# Patient Record
Sex: Female | Born: 1965
Health system: Southern US, Community
[De-identification: ages and names within clinical notes are randomized; demographics above are authoritative.]

## PROBLEM LIST (undated history)

## (undated) DIAGNOSIS — D649 Anemia, unspecified: Secondary | ICD-10-CM

## (undated) DIAGNOSIS — E785 Hyperlipidemia, unspecified: Secondary | ICD-10-CM

## (undated) DIAGNOSIS — T7840XA Allergy, unspecified, initial encounter: Secondary | ICD-10-CM

## (undated) DIAGNOSIS — Z9889 Other specified postprocedural states: Secondary | ICD-10-CM

## (undated) DIAGNOSIS — R112 Nausea with vomiting, unspecified: Secondary | ICD-10-CM

## (undated) HISTORY — DX: Nausea with vomiting, unspecified: R11.2

## (undated) HISTORY — PX: OTHER SURGICAL HISTORY: SHX169

## (undated) HISTORY — DX: Hyperlipidemia, unspecified: E78.5

## (undated) HISTORY — DX: Allergy, unspecified, initial encounter: T78.40XA

## (undated) HISTORY — DX: Other specified postprocedural states: Z98.890

## (undated) HISTORY — PX: WISDOM TOOTH EXTRACTION: SHX21

## (undated) HISTORY — DX: Anemia, unspecified: D64.9

---

## 2001-12-29 ENCOUNTER — Other Ambulatory Visit: Admission: RE | Admit: 2001-12-29 | Discharge: 2001-12-29 | Payer: Self-pay | Admitting: Obstetrics & Gynecology

## 2002-08-01 ENCOUNTER — Encounter: Payer: Self-pay | Admitting: Obstetrics and Gynecology

## 2002-08-01 ENCOUNTER — Inpatient Hospital Stay (HOSPITAL_COMMUNITY): Admission: AD | Admit: 2002-08-01 | Discharge: 2002-08-01 | Payer: Self-pay | Admitting: *Deleted

## 2002-08-07 ENCOUNTER — Inpatient Hospital Stay (HOSPITAL_COMMUNITY): Admission: AD | Admit: 2002-08-07 | Discharge: 2002-08-09 | Payer: Self-pay | Admitting: Obstetrics and Gynecology

## 2003-12-08 ENCOUNTER — Other Ambulatory Visit: Admission: RE | Admit: 2003-12-08 | Discharge: 2003-12-08 | Payer: Self-pay | Admitting: Obstetrics and Gynecology

## 2012-05-27 ENCOUNTER — Other Ambulatory Visit: Payer: Self-pay | Admitting: Obstetrics and Gynecology

## 2012-05-27 DIAGNOSIS — R928 Other abnormal and inconclusive findings on diagnostic imaging of breast: Secondary | ICD-10-CM

## 2012-06-04 ENCOUNTER — Ambulatory Visit
Admission: RE | Admit: 2012-06-04 | Discharge: 2012-06-04 | Disposition: A | Discharge status: Home / Self Care | Payer: BC Managed Care – PPO | Source: Ambulatory Visit | Attending: Obstetrics and Gynecology | Admitting: Obstetrics and Gynecology

## 2012-06-04 DIAGNOSIS — R928 Other abnormal and inconclusive findings on diagnostic imaging of breast: Secondary | ICD-10-CM

## 2013-01-27 ENCOUNTER — Ambulatory Visit: Payer: Self-pay

## 2013-05-12 HISTORY — PX: CHOLECYSTECTOMY: SHX55

## 2013-11-09 ENCOUNTER — Encounter (INDEPENDENT_AMBULATORY_CARE_PROVIDER_SITE_OTHER): Payer: Self-pay | Admitting: General Surgery

## 2013-11-09 ENCOUNTER — Ambulatory Visit (INDEPENDENT_AMBULATORY_CARE_PROVIDER_SITE_OTHER): Payer: BC Managed Care – PPO | Admitting: General Surgery

## 2013-11-09 VITALS — BP 126/84 | HR 73 | Temp 97.7°F | Ht 65.0 in | Wt 165.0 lb

## 2013-11-09 DIAGNOSIS — K8020 Calculus of gallbladder without cholecystitis without obstruction: Secondary | ICD-10-CM

## 2013-11-09 NOTE — Progress Notes (Signed)
Patient ID: Sydney Knapp, female   DOB: 14-Dec-1965, 48 y.o.   MRN: [id number]  Chief Complaint  Patient presents with  . gallbaldder    HPI Sydney Knapp is a 48 y.o. female.  Patient is a 48 year old female was referred by Dr. Gilford Rile for evaluation of symptomatic cholelithiasis. Patient is long history of epigastric right upper quadrant pain. She did notice that the last episode was triggered by a fatty meal. This recurred last week.   The patient was previous workup with an ultrasound which revealed multiple gallstones. LFTs were within normal limits. HPI  Past Medical History  Diagnosis Date  . Anemia     History reviewed. No pertinent past surgical history.  History reviewed. No pertinent family history.  Social History History  Substance Use Topics  . Smoking status: Never Smoker   . Smokeless tobacco: Not on file  . Alcohol Use: No    No Known Allergies  No current outpatient prescriptions on file.   No current facility-administered medications for this visit.    Review of Systems Review of Systems  Constitutional: Negative.   HENT: Negative.   Respiratory: Negative.   Cardiovascular: Negative.   Gastrointestinal: Negative.   Neurological: Negative.   All other systems reviewed and are negative.   Blood pressure 126/84, pulse 73, temperature 97.7 F (36.5 C), height 5\' 5"  (1.651 m), weight 165 lb (74.844 kg).  Physical Exam Physical Exam  Constitutional: She is oriented to person, place, and time. She appears well-developed and well-nourished.  HENT:  Head: Normocephalic and atraumatic.  Eyes: Conjunctivae and EOM are normal. Pupils are equal, round, and reactive to light.  Neck: Normal range of motion. Neck supple.  Cardiovascular: Normal rate, regular rhythm and normal heart sounds.   Pulmonary/Chest: Effort normal and breath sounds normal.  Abdominal: Soft. Bowel sounds are normal. She exhibits no distension and no mass. There is no tenderness.  There is no rebound and no guarding.  Musculoskeletal: Normal range of motion.  Neurological: She is alert and oriented to person, place, and time.  Skin: Skin is warm and dry.  Psychiatric: She has a normal mood and affect.    Data Reviewed Ultrasound reveals multiple gallstones  Assessment    48 year old female with symptomatic cholelithiasis     Plan    1. We'll proceed to the operating room for laparoscopic cholecystectomy 2. All risks and benefits were discussed with the patient to generally include: infection, bleeding, possible need for post op ERCP, damage to the bile ducts, and bile leak. Alternatives were offered and described.  All questions were answered and the patient voiced understanding of the procedure and wishes to proceed at this point with a laparoscopic cholecystectomy         Rosario Jacks., Luanne Krzyzanowski 11/09/2013, 9:12 AM

## 2013-11-18 ENCOUNTER — Ambulatory Visit (INDEPENDENT_AMBULATORY_CARE_PROVIDER_SITE_OTHER): Payer: BC Managed Care – PPO | Admitting: General Surgery

## 2013-12-30 ENCOUNTER — Other Ambulatory Visit (INDEPENDENT_AMBULATORY_CARE_PROVIDER_SITE_OTHER): Payer: Self-pay

## 2013-12-30 DIAGNOSIS — K8010 Calculus of gallbladder with chronic cholecystitis without obstruction: Secondary | ICD-10-CM

## 2013-12-30 MED ORDER — OXYCODONE-ACETAMINOPHEN 5-325 MG PO TABS: 1.0000 | ORAL_TABLET | ORAL | Status: AC | PRN

## 2014-01-02 ENCOUNTER — Telehealth (INDEPENDENT_AMBULATORY_CARE_PROVIDER_SITE_OTHER): Payer: Self-pay | Admitting: General Surgery

## 2014-01-02 ENCOUNTER — Other Ambulatory Visit (INDEPENDENT_AMBULATORY_CARE_PROVIDER_SITE_OTHER): Payer: Self-pay

## 2014-01-02 NOTE — Telephone Encounter (Signed)
Husband of the patient called and stated that wife has had nausea and vomiting since her gallbladder surgery. Spouse was told that Dr. Rosendo Gros, assistant. Christy, CMA would give the pt a call-back.

## 2014-01-03 ENCOUNTER — Telehealth (INDEPENDENT_AMBULATORY_CARE_PROVIDER_SITE_OTHER): Payer: Self-pay

## 2014-01-03 NOTE — Telephone Encounter (Signed)
Message copied by Ivor Costa on Tue Jan 03, 2014 10:16 AM ------      Message from: Ralene Ok      Created: Tue Jan 03, 2014  7:25 AM       I bet she's taking percocet on an empty stomach.  Can you just call her and see why she's have nausea as per the phone call note.            AR ------

## 2014-01-03 NOTE — Telephone Encounter (Signed)
Follow up call to patient-  Called and spoke to patient to see if she's still having nausea.  Patient reports that she feels much better today.  Patient denies any nausea or vomiting at this time.  Patient post op appointment was changed from 02/02/14 to 01/17/14 @ 9:45 am with Dr. Rosendo Gros.  Patient advised to call our office if her nausea/vomting returns.  Patient verbalized understanding.

## 2014-01-03 NOTE — Telephone Encounter (Signed)
Status: Signed        Follow up call to patient- Called and spoke to patient to see if she's still having nausea. Patient reports that she feels much better today. Patient denies any nausea or vomiting at this time. Patient post op appointment was changed from 02/02/14 to 01/17/14 @ 9:45 am with Dr. Rosendo Gros. Patient advised to call our office if her nausea/vomting returns. Patient verbalized understanding.

## 2014-01-17 ENCOUNTER — Encounter (INDEPENDENT_AMBULATORY_CARE_PROVIDER_SITE_OTHER): Payer: BC Managed Care – PPO | Admitting: General Surgery

## 2014-02-02 ENCOUNTER — Encounter (INDEPENDENT_AMBULATORY_CARE_PROVIDER_SITE_OTHER): Payer: BC Managed Care – PPO | Admitting: General Surgery

## 2016-09-23 DIAGNOSIS — Z1231 Encounter for screening mammogram for malignant neoplasm of breast: Secondary | ICD-10-CM | POA: Diagnosis not present

## 2016-09-23 DIAGNOSIS — Z124 Encounter for screening for malignant neoplasm of cervix: Secondary | ICD-10-CM | POA: Diagnosis not present

## 2016-10-08 DIAGNOSIS — L237 Allergic contact dermatitis due to plants, except food: Secondary | ICD-10-CM | POA: Diagnosis not present

## 2017-04-03 DIAGNOSIS — Z23 Encounter for immunization: Secondary | ICD-10-CM | POA: Diagnosis not present

## 2017-08-17 DIAGNOSIS — Z1389 Encounter for screening for other disorder: Secondary | ICD-10-CM | POA: Diagnosis not present

## 2017-08-17 DIAGNOSIS — Z0000 Encounter for general adult medical examination without abnormal findings: Secondary | ICD-10-CM | POA: Diagnosis not present

## 2017-08-26 DIAGNOSIS — M7712 Lateral epicondylitis, left elbow: Secondary | ICD-10-CM | POA: Diagnosis not present

## 2017-09-22 ENCOUNTER — Encounter: Payer: Self-pay | Admitting: Internal Medicine

## 2017-11-11 DIAGNOSIS — Z1231 Encounter for screening mammogram for malignant neoplasm of breast: Secondary | ICD-10-CM | POA: Diagnosis not present

## 2017-11-11 DIAGNOSIS — Z01419 Encounter for gynecological examination (general) (routine) without abnormal findings: Secondary | ICD-10-CM | POA: Diagnosis not present

## 2018-01-08 ENCOUNTER — Other Ambulatory Visit: Payer: Self-pay | Admitting: Obstetrics and Gynecology

## 2018-01-08 DIAGNOSIS — N6320 Unspecified lump in the left breast, unspecified quadrant: Secondary | ICD-10-CM

## 2018-01-08 DIAGNOSIS — N63 Unspecified lump in unspecified breast: Secondary | ICD-10-CM | POA: Diagnosis not present

## 2018-01-12 ENCOUNTER — Ambulatory Visit
Admission: RE | Admit: 2018-01-12 | Discharge: 2018-01-12 | Disposition: A | Discharge status: Home / Self Care | Payer: 59 | Source: Ambulatory Visit | Attending: Obstetrics and Gynecology | Admitting: Obstetrics and Gynecology

## 2018-01-12 DIAGNOSIS — N6320 Unspecified lump in the left breast, unspecified quadrant: Secondary | ICD-10-CM

## 2018-01-12 DIAGNOSIS — N6322 Unspecified lump in the left breast, upper inner quadrant: Secondary | ICD-10-CM | POA: Diagnosis not present

## 2018-01-12 DIAGNOSIS — N6321 Unspecified lump in the left breast, upper outer quadrant: Secondary | ICD-10-CM | POA: Diagnosis not present

## 2018-01-12 DIAGNOSIS — R922 Inconclusive mammogram: Secondary | ICD-10-CM | POA: Diagnosis not present

## 2018-02-10 DIAGNOSIS — Z23 Encounter for immunization: Secondary | ICD-10-CM | POA: Diagnosis not present

## 2018-08-09 DIAGNOSIS — E7849 Other hyperlipidemia: Secondary | ICD-10-CM | POA: Diagnosis not present

## 2018-08-09 DIAGNOSIS — Z0000 Encounter for general adult medical examination without abnormal findings: Secondary | ICD-10-CM | POA: Diagnosis not present

## 2018-08-25 DIAGNOSIS — Z0000 Encounter for general adult medical examination without abnormal findings: Secondary | ICD-10-CM | POA: Diagnosis not present

## 2018-08-26 DIAGNOSIS — D509 Iron deficiency anemia, unspecified: Secondary | ICD-10-CM | POA: Diagnosis not present

## 2018-09-08 ENCOUNTER — Other Ambulatory Visit (HOSPITAL_COMMUNITY): Payer: Self-pay | Admitting: *Deleted

## 2018-09-09 ENCOUNTER — Ambulatory Visit (HOSPITAL_COMMUNITY)
Admission: RE | Admit: 2018-09-09 | Discharge: 2018-09-09 | Disposition: A | Discharge status: Home / Self Care | Payer: 59 | Source: Ambulatory Visit | Attending: Internal Medicine | Admitting: Internal Medicine

## 2018-09-09 ENCOUNTER — Other Ambulatory Visit: Payer: Self-pay

## 2018-09-09 DIAGNOSIS — D649 Anemia, unspecified: Secondary | ICD-10-CM | POA: Diagnosis not present

## 2018-09-09 MED ORDER — SODIUM CHLORIDE 0.9 % IV SOLN
510.0000 mg | Freq: Once | INTRAVENOUS | Status: AC
  Administered 2018-09-09: 510 mg via INTRAVENOUS
  Filled 2018-09-09: qty 510

## 2018-09-09 NOTE — Discharge Instructions (Signed)

## 2018-09-16 DIAGNOSIS — D509 Iron deficiency anemia, unspecified: Secondary | ICD-10-CM | POA: Diagnosis not present

## 2019-04-23 ENCOUNTER — Other Ambulatory Visit: Payer: Self-pay

## 2019-04-23 DIAGNOSIS — Z20822 Contact with and (suspected) exposure to covid-19: Secondary | ICD-10-CM

## 2019-04-23 NOTE — Progress Notes (Signed)
lab

## 2019-04-24 ENCOUNTER — Telehealth: Payer: Self-pay | Admitting: Critical Care Medicine

## 2019-04-24 LAB — NOVEL CORONAVIRUS, NAA: SARS-CoV-2, NAA: DETECTED — AB

## 2019-04-24 NOTE — Telephone Encounter (Signed)
-----   Message from Carlisle Beers, RN sent at 04/24/2019  4:23 PM EST -----  ----- Message ----- From: Interface, [facility] Lab Results In Sent: 04/24/2019   2:36 PM EST To: Mobile Screening Testing Result Pool

## 2019-04-24 NOTE — Telephone Encounter (Signed)
This patient is Covid positive from December 12 event.  She has mild headaches nasal congestion eye irritation loss of taste and smell.  She became symptomatic on the 11th.  She knows she needs to stay in isolation until 22 December.  She is not a candidate for monoclonal antibody

## 2019-09-29 ENCOUNTER — Ambulatory Visit: Payer: 59

## 2019-10-01 ENCOUNTER — Ambulatory Visit: Payer: 59 | Attending: Internal Medicine

## 2019-10-01 DIAGNOSIS — Z23 Encounter for immunization: Secondary | ICD-10-CM

## 2019-10-01 NOTE — Progress Notes (Signed)
   Covid-19 Vaccination Clinic  Name:  Sydney Knapp    MRN: [id number] DOB: 1966/01/18  10/01/2019  [name] was observed post Covid-19 immunization for 15 minutes without incident. She was provided with Vaccine Information Sheet and instruction to access the V-Safe system.   [name] was instructed to call 911 with any severe reactions post vaccine: Marland Kitchen Difficulty breathing  . Swelling of face and throat  . A fast heartbeat  . A bad rash all over body  . Dizziness and weakness   Immunizations Administered    Name Date Dose VIS Date Route   Pfizer COVID-19 Vaccine 10/01/2019  9:57 AM 0.3 mL 07/06/2018 Intramuscular   Manufacturer: [place]   Lot: [id number]   [place]: [id number]

## 2019-10-03 ENCOUNTER — Encounter: Payer: Self-pay | Admitting: Gastroenterology

## 2019-10-24 ENCOUNTER — Ambulatory Visit: Payer: 59 | Attending: Internal Medicine

## 2019-10-24 DIAGNOSIS — Z23 Encounter for immunization: Secondary | ICD-10-CM

## 2019-10-24 NOTE — Progress Notes (Signed)
   Covid-19 Vaccination Clinic  Name:  LATRIA MCCARRON    MRN: [id number] DOB: [birth year]  10/24/2019  Ms. Meske was observed post Covid-19 immunization for 15 minutes without incident. She was provided with Vaccine Information Sheet and instruction to access the V-Safe system.   Ms. Tourigny was instructed to call 911 with any severe reactions post vaccine: Marland Kitchen Difficulty breathing  . Swelling of face and throat  . A fast heartbeat  . A bad rash all over body  . Dizziness and weakness   Immunizations Administered    Name Date Dose VIS Date Route   Pfizer COVID-19 Vaccine 10/24/2019  4:20 PM 0.3 mL 07/06/2018 Intramuscular   Manufacturer: [place]   Lot: [id number]   [place]: [address]-0

## 2019-11-03 ENCOUNTER — Other Ambulatory Visit: Payer: Self-pay

## 2019-11-03 ENCOUNTER — Ambulatory Visit (AMBULATORY_SURGERY_CENTER): Payer: 59 | Admitting: *Deleted

## 2019-11-03 VITALS — Ht 66.0 in | Wt 165.0 lb

## 2019-11-03 DIAGNOSIS — Z1211 Encounter for screening for malignant neoplasm of colon: Secondary | ICD-10-CM

## 2019-11-03 MED ORDER — SUTAB 1479-225-188 MG PO TABS: 24.0000 | ORAL_TABLET | ORAL | 0 refills | Status: DC

## 2019-11-03 NOTE — Progress Notes (Signed)
10-24-2019 covid vacc x2   No egg or soy allergy known to patient   issues with past sedation with any surgeries  or procedures Of PONV   no intubation problems  No diet pills per patient No home 02 use per patient  No blood thinners per patient  Pt denies issues with constipation  No A fib or A flutter  EMMI video sent to pt's e mail  COVID 19 guidelines implemented in [place] today   sutab code to pharmacy, coupon to pt   Due to the COVID-19 pandemic we are asking patients to follow these guidelines. Please only bring one care partner. Please be aware that your care partner may wait in the car in the parking lot or if they feel like they will be too hot to wait in the car, they may wait in the lobby on the 4th floor. All care partners are required to wear a mask the entire time (we do not have any that we can provide them), they need to practice social distancing, and we will do a Covid check for all patient's and care partners when you arrive. Also we will check their temperature and your temperature. If the care partner waits in their car they need to stay in the parking lot the entire time and we will call them on their cell phone when the patient is ready for discharge so they can bring the car to the front of the building. Also all patient's will need to wear a mask into building.  Pt verified name, DOB, address and insurance during PV today. Pt mailed instruction packet to included paper to complete and mail back to [facility] with addressed and stamped envelope, Emmi video, copy of consent form to read and not return, and instructions. sutab  coupon mailed in packet. sutab code to pharmacy  PV completed over the phone. Pt encouraged to call with questions or issues

## 2019-11-04 ENCOUNTER — Encounter: Payer: Self-pay | Admitting: Gastroenterology

## 2019-11-17 ENCOUNTER — Encounter: Payer: Self-pay | Admitting: Certified Registered Nurse Anesthetist

## 2019-11-18 ENCOUNTER — Encounter: Payer: Self-pay | Admitting: Gastroenterology

## 2019-11-18 ENCOUNTER — Ambulatory Visit (AMBULATORY_SURGERY_CENTER): Payer: 59 | Admitting: Gastroenterology

## 2019-11-18 ENCOUNTER — Other Ambulatory Visit: Payer: Self-pay

## 2019-11-18 VITALS — BP 105/67 | HR 62 | Temp 97.5°F | Resp 18 | Ht 66.0 in | Wt 165.0 lb

## 2019-11-18 DIAGNOSIS — Z1211 Encounter for screening for malignant neoplasm of colon: Secondary | ICD-10-CM

## 2019-11-18 DIAGNOSIS — D122 Benign neoplasm of ascending colon: Secondary | ICD-10-CM

## 2019-11-18 DIAGNOSIS — K635 Polyp of colon: Secondary | ICD-10-CM

## 2019-11-18 MED ORDER — SODIUM CHLORIDE 0.9 % IV SOLN: 500.0000 mL | Freq: Once | INTRAVENOUS | Status: DC

## 2019-11-18 NOTE — Progress Notes (Signed)
Report given to PACU, vss 

## 2019-11-18 NOTE — Op Note (Signed)
[place] Patient Name: Sydney Knapp Procedure Date: 11/18/2019 8:48 AM MRN: [id number] Endoscopist: Thornton Park MD, MD Age: 54 Referring MD:  Date of Birth: [birth year] Gender: Female Account #: [id number] Procedure:                Colonoscopy Indications:              Screening for colorectal malignant neoplasm, This                            is the patient's first colonoscopy                           No known family history of colon cancer or polyps Medicines:                Monitored Anesthesia Care Procedure:                Pre-Anesthesia Assessment:                           - Prior to the procedure, a History and Physical                            was performed, and patient medications and                            allergies were reviewed. The patient's tolerance of                            previous anesthesia was also reviewed. The risks                            and benefits of the procedure and the sedation                            options and risks were discussed with the patient.                            All questions were answered, and informed consent                            was obtained. Prior Anticoagulants: The patient has                            taken no previous anticoagulant or antiplatelet                            agents. ASA Grade Assessment: I - A normal, healthy                            patient. After reviewing the risks and benefits,                            the patient was deemed in satisfactory condition to  undergo the procedure.                           After obtaining informed consent, the colonoscope                            was passed under direct vision. Throughout the                            procedure, the patient's blood pressure, pulse, and                            oxygen saturations were monitored continuously. The                            Colonoscope was introduced through  the anus and                            advanced to the 4 cm into the ileum. The                            colonoscopy was performed without difficulty. The                            patient tolerated the procedure well. The quality                            of the bowel preparation was good. The terminal                            ileum, ileocecal valve, appendiceal orifice, and                            rectum were photographed. Scope In: 8:54:49 AM Scope Out: 9:09:14 AM Scope Withdrawal Time: 0 hours 9 minutes 18 seconds  Total Procedure Duration: 0 hours 14 minutes 25 seconds  Findings:                 The perianal and digital rectal examinations were                            normal.                           A 4 mm polyp was found in the ascending colon. The                            polyp was flat. The polyp was removed with a cold                            snare. Resection and retrieval were complete.                            Estimated blood loss was minimal.  The exam was otherwise without abnormality on                            direct and retroflexion views. Complications:            No immediate complications. Estimated blood loss:                            Minimal. Estimated Blood Loss:     Estimated blood loss was minimal. Impression:               - One 4 mm polyp in the ascending colon, removed                            with a cold snare. Resected and retrieved.                           - The examination was otherwise normal on direct                            and retroflexion views. Recommendation:           - Patient has a contact number available for                            emergencies. The signs and symptoms of potential                            delayed complications were discussed with the                            patient. Return to normal activities tomorrow.                            Written discharge instructions  were provided to the                            patient.                           - Resume previous diet.                           - Continue present medications.                           - Await pathology results.                           - Repeat colonoscopy date to be determined after                            pending pathology results are reviewed for                            surveillance.                           -  Emerging evidence supports eating a diet of                            fruits, vegetables, grains, calcium, and yogurt                            while reducing red meat and alcohol may reduce the                            risk of colon cancer.                           - Thank you for allowing me to be involved in your                            colon cancer prevention. Thornton Park MD, MD 11/18/2019 9:16:55 AM This report has been signed electronically.

## 2019-11-18 NOTE — Progress Notes (Signed)
Called to room to assist during endoscopic procedure.  Patient ID and intended procedure confirmed with present staff. Received instructions for my participation in the procedure from the performing physician.  

## 2019-11-18 NOTE — Progress Notes (Signed)
Pt's states no medical or surgical changes since previsit or office visit. 

## 2019-11-18 NOTE — Patient Instructions (Signed)
Handout given for polyps, await pathology results.  YOU HAD AN ENDOSCOPIC PROCEDURE TODAY AT [facility]:   Refer to the procedure report that was given to you for any specific questions about what was found during the examination.  If the procedure report does not answer your questions, please call your gastroenterologist to clarify.  If you requested that your care partner not be given the details of your procedure findings, then the procedure report has been included in a sealed envelope for you to review at your convenience later.  YOU SHOULD EXPECT: Some feelings of bloating in the abdomen. Passage of more gas than usual.  Walking can help get rid of the air that was put into your GI tract during the procedure and reduce the bloating. If you had a lower endoscopy (such as a colonoscopy or flexible sigmoidoscopy) you may notice spotting of blood in your stool or on the toilet paper. If you underwent a bowel prep for your procedure, you may not have a normal bowel movement for a few days.  Please Note:  You might notice some irritation and congestion in your nose or some drainage.  This is from the oxygen used during your procedure.  There is no need for concern and it should clear up in a day or so.  SYMPTOMS TO REPORT IMMEDIATELY:   Following lower endoscopy (colonoscopy or flexible sigmoidoscopy):  Excessive amounts of blood in the stool  Significant tenderness or worsening of abdominal pains  Swelling of the abdomen that is new, acute  Fever of 100F or higher  For urgent or emergent issues, a gastroenterologist can be reached at any hour by calling [phone]. Do not use MyChart messaging for urgent concerns.    DIET:  We do recommend a small meal at first, but then you may proceed to your regular diet.  Drink plenty of fluids but you should avoid alcoholic beverages for 24 hours.  ACTIVITY:  You should plan to take it easy for the rest of today and you should NOT  DRIVE or use heavy machinery until tomorrow (because of the sedation medicines used during the test).    FOLLOW UP: Our staff will call the number listed on your records 48-72 hours following your procedure to check on you and address any questions or concerns that you may have regarding the information given to you following your procedure. If we do not reach you, we will leave a message.  We will attempt to reach you two times.  During this call, we will ask if you have developed any symptoms of COVID 19. If you develop any symptoms (ie: fever, flu-like symptoms, shortness of breath, cough etc.) before then, please call [phone].  If you test positive for Covid 19 in the 2 weeks post procedure, please call and report this information to [place].    If any biopsies were taken you will be contacted by phone or by letter within the next 1-3 weeks.  Please call us at [phone] if you have not heard about the biopsies in 3 weeks.    SIGNATURES/CONFIDENTIALITY: You and/or your care partner have signed paperwork which will be entered into your electronic medical record.  These signatures attest to the fact that that the information above on your After Visit Summary has been reviewed and is understood.  Full responsibility of the confidentiality of this discharge information lies with you and/or your care-partner.

## 2019-11-22 ENCOUNTER — Telehealth: Payer: Self-pay

## 2019-11-22 NOTE — Telephone Encounter (Signed)
  Follow up Call-  Call back number 11/18/2019  Post procedure Call Back phone  # [phone]  Permission to leave phone message Yes  Some recent data might be hidden     Patient questions:  Do you have a fever, pain , or abdominal swelling? No. Pain Score  0 *  Have you tolerated food without any problems? Yes.    Have you been able to return to your normal activities? Yes.    Do you have any questions about your discharge instructions: Diet   No. Medications  No. Follow up visit  No.  Do you have questions or concerns about your Care? No.  Actions: * If pain score is 4 or above: No action needed, pain <4.  1. Have you developed a fever since your procedure? no  2.   Have you had an respiratory symptoms (SOB or cough) since your procedure? no  3.   Have you tested positive for COVID 19 since your procedure no  4.   Have you had any family members/close contacts diagnosed with the COVID 19 since your procedure?  no   If yes to any of these questions please route to Joylene John, RN and Erenest Rasher, RN

## 2019-11-22 NOTE — Telephone Encounter (Signed)
Left message on answering machine. 

## 2019-12-01 ENCOUNTER — Encounter: Payer: Self-pay | Admitting: Gastroenterology

## 2021-09-18 ENCOUNTER — Other Ambulatory Visit: Payer: Self-pay | Admitting: Internal Medicine

## 2021-09-18 DIAGNOSIS — Z0000 Encounter for general adult medical examination without abnormal findings: Secondary | ICD-10-CM

## 2021-10-23 ENCOUNTER — Ambulatory Visit
Admission: RE | Admit: 2021-10-23 | Discharge: 2021-10-23 | Disposition: A | Discharge status: Home / Self Care | Payer: No Typology Code available for payment source | Source: Ambulatory Visit | Attending: Internal Medicine | Admitting: Internal Medicine

## 2021-10-23 DIAGNOSIS — Z0000 Encounter for general adult medical examination without abnormal findings: Secondary | ICD-10-CM

## 2022-07-27 IMAGING — CT CT CARDIAC CORONARY ARTERY CALCIUM SCORE
3 series · 13 of 20 positions shown, 15 images · non-contrast
Comparison: None Available.

CLINICAL DATA: 56-year-old white female

EXAM:
CT CARDIAC CORONARY ARTERY CALCIUM SCORE
TECHNIQUE: Non-contrast imaging through the heart was performed using
prospective ECG gating. Image post processing was performed on an
independent workstation, allowing for quantitative analysis of the
heart and coronary arteries. Note that this exam targets the heart
and the chest was not imaged in its entirety.

[Series 2: calcium scoring 2.00 qr36 bestdiast 69% hrt calciu · axial · 0.35mm/px · z∈[+1544,+1600]mm · 3 of 70 slices shown]
[im 14/70  vessel]
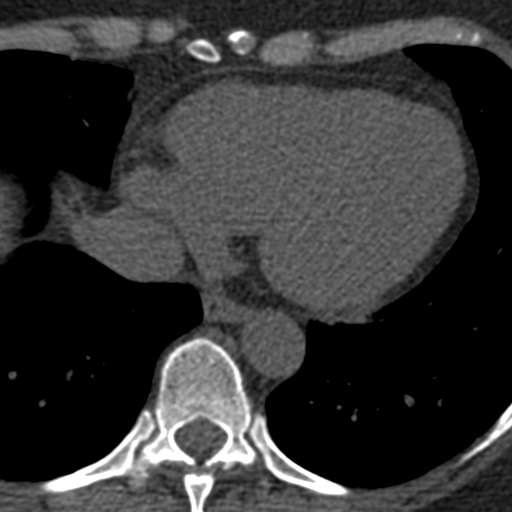
[im 28/70  vessel]
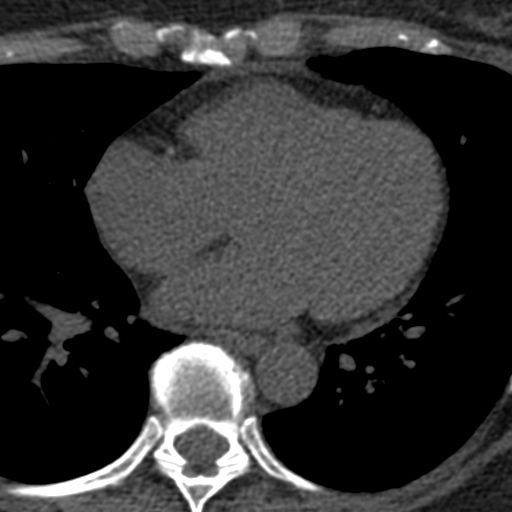
[im 42/70  vessel]
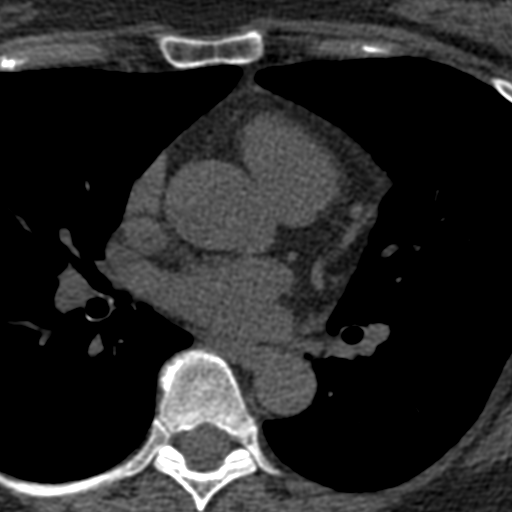

[Series 3: calcium scoring 2.00 br40 bestdiast 69% axial · axial · 0.57mm/px · z∈[+1540,+1632]mm · 5 of 70 slices shown, 7 images]
[im 12/70  vessel]
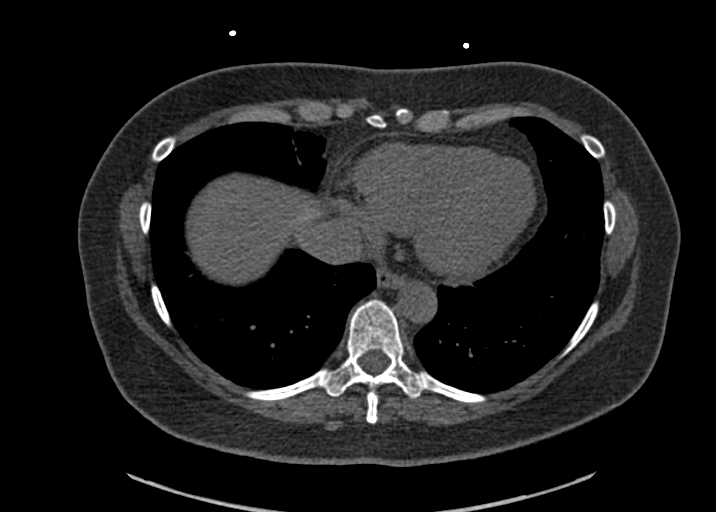
[im 12/70  lung]
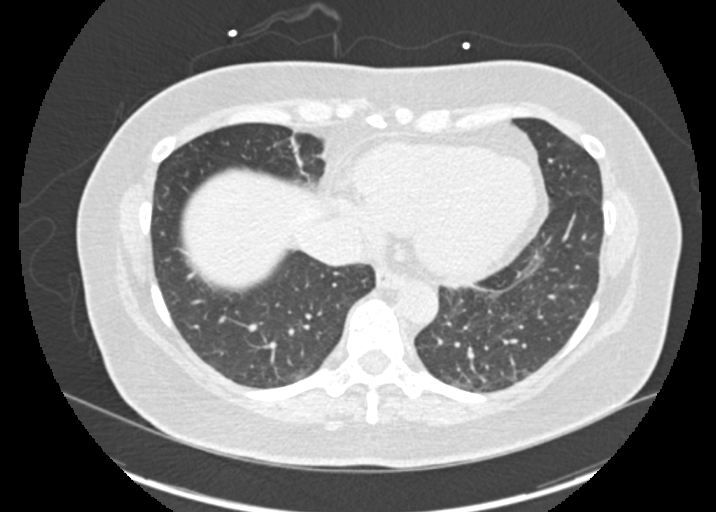
[im 24/70  vessel]
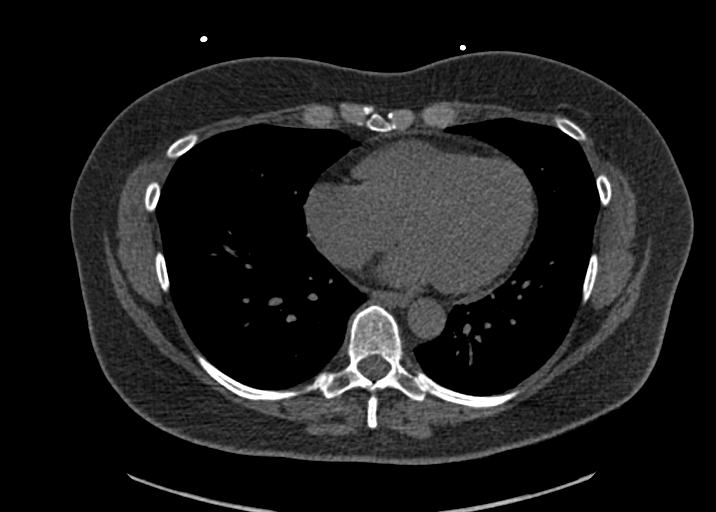
[im 35/70  vessel]
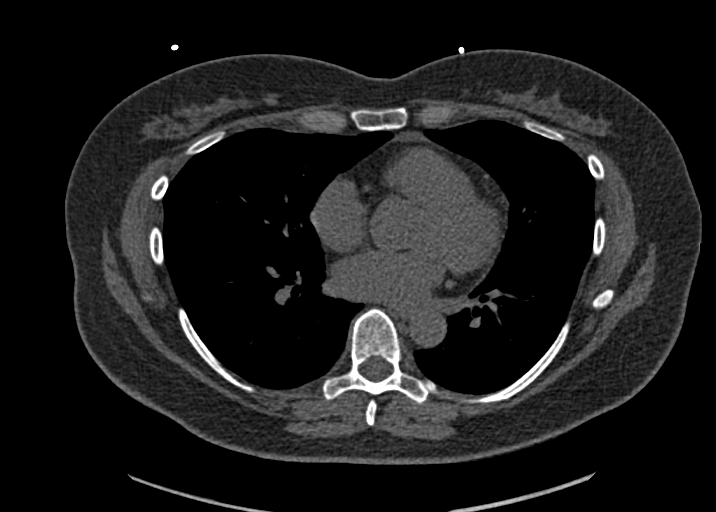
[im 47/70  vessel]
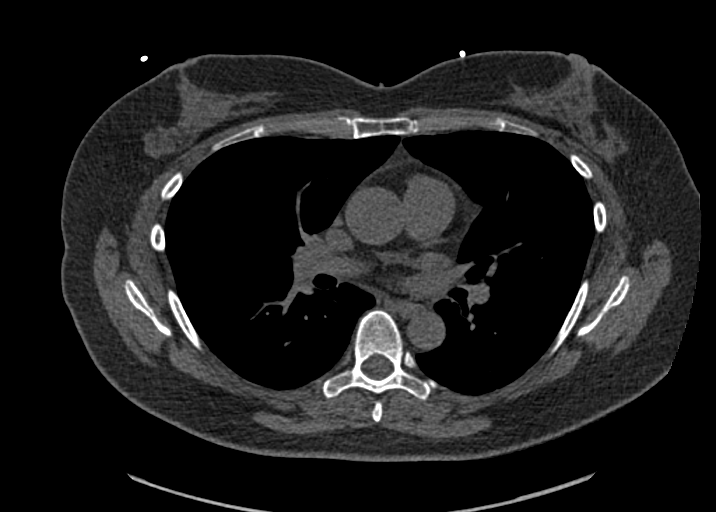
[im 58/70  vessel]
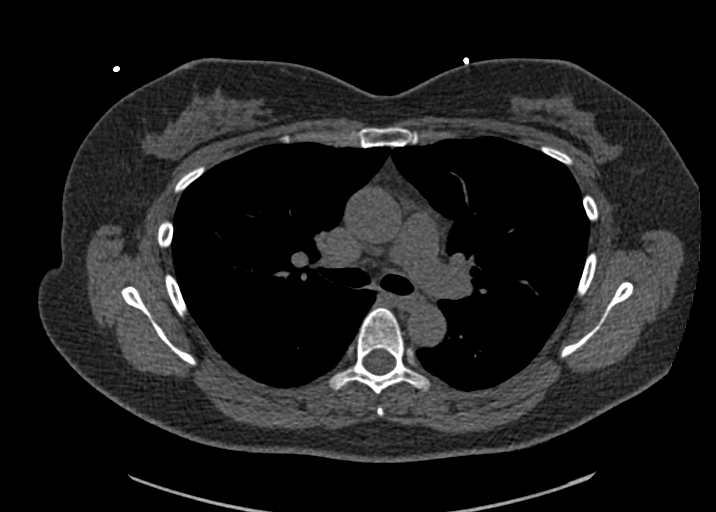
[im 58/70  lung]
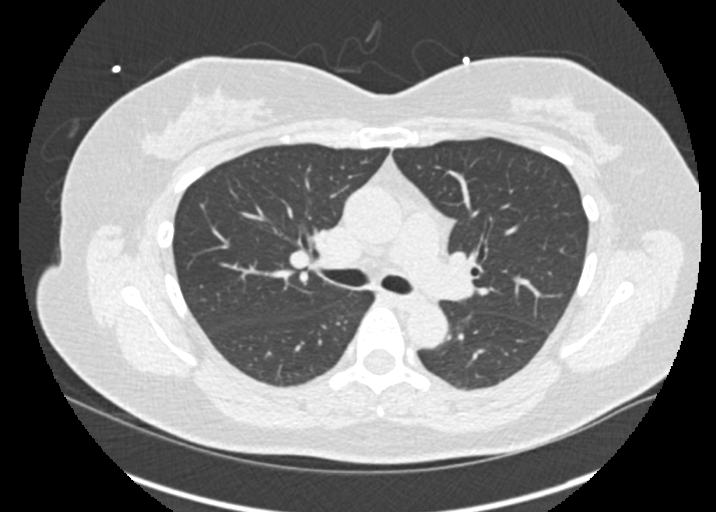

[Series 9: calcium scoring 2.00 br60 bestdiast 69% lungs · axial · 0.57mm/px · z∈[+1540,+1632]mm · 5 of 70 slices shown]
[im 12/70  vessel]
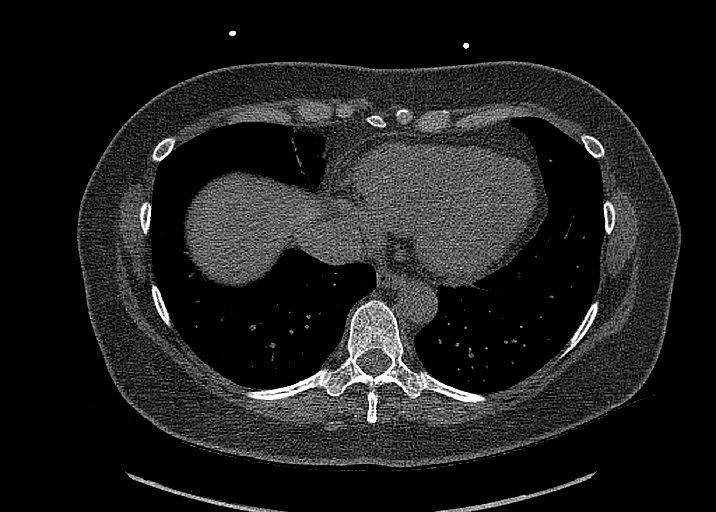
[im 24/70  vessel]
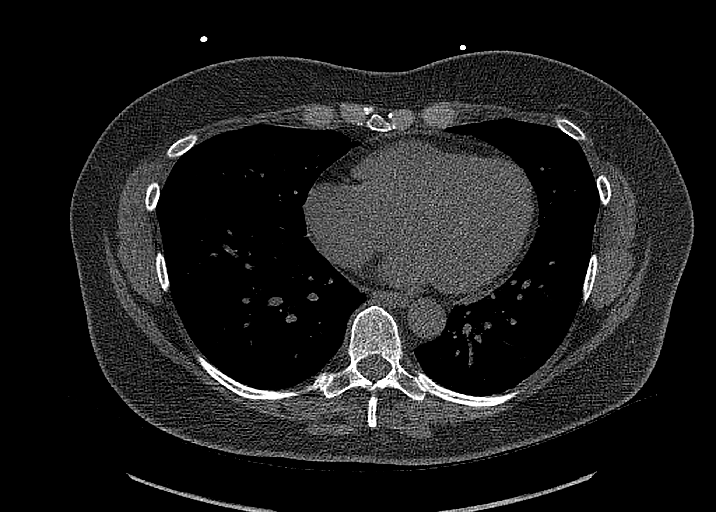
[im 35/70  vessel]
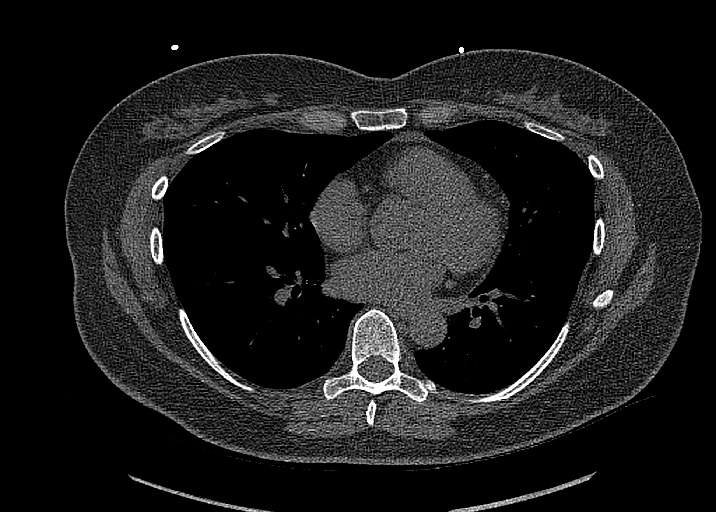
[im 47/70  vessel]
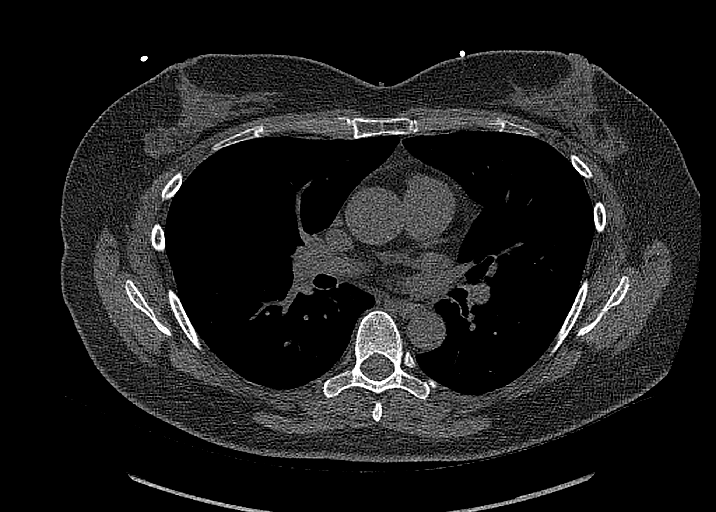
[im 58/70  vessel]
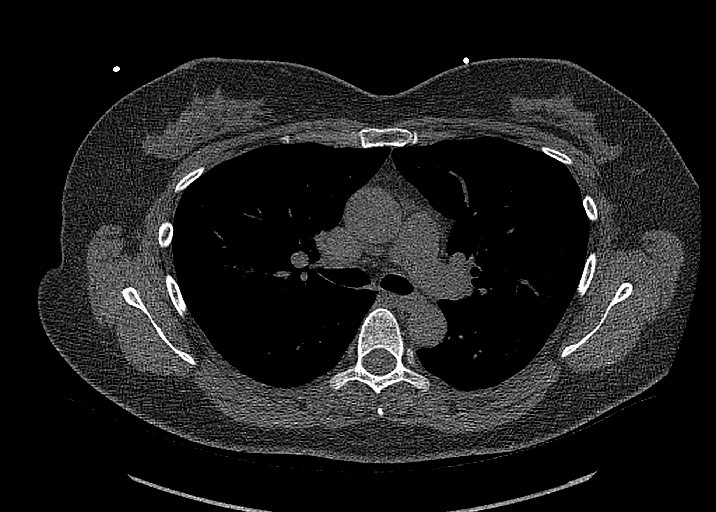

[13 of 20 positions shown; findings below may reference images not displayed]

FINDINGS: CORONARY CALCIUM SCORES:

Left Main: 0

LAD: 0

LCx: 0

RCA: 0

Total Agatston Score: 0

[HOSPITAL] percentile: 0

AORTA MEASUREMENTS:

Ascending Aorta: 33 mm

Descending Aorta: 21 mm

OTHER FINDINGS:

Vascular: Normal heart size. Trace pericardial effusion. Normal
caliber thoracic aorta with no significant atherosclerotic disease.

Mediastinum/Nodes: Esophagus is unremarkable. No enlarged lymph
nodes seen in the chest.

Lungs/Pleura: Central airways are patent. No consolidation, pleural
effusion or pneumothorax. Mild bibasilar atelectasis. Small solid
pulmonary nodule of the right lower lobe measuring 5 mm on series 9,
image 67.

Upper Abdomen: No acute abnormality.

Musculoskeletal: No chest wall mass or suspicious bone lesions
identified.
IMPRESSION: 1. Total calcium score of 0 is at percentile 0 for subjects of the
same age, gender, and race/ethnicity.
2. Small solid pulmonary nodule of the right lower lobe measuring 5
mm. No follow-up needed if patient is low-risk.This recommendation
follows the consensus statement: Guidelines for Management of
Incidental Pulmonary Nodules Detected on CT Images: From the

## 2022-10-10 ENCOUNTER — Other Ambulatory Visit: Payer: Self-pay | Admitting: Internal Medicine

## 2022-10-10 DIAGNOSIS — R911 Solitary pulmonary nodule: Secondary | ICD-10-CM

## 2022-11-12 ENCOUNTER — Ambulatory Visit
Admission: RE | Admit: 2022-11-12 | Discharge: 2022-11-12 | Disposition: A | Discharge status: Home / Self Care | Payer: 59 | Source: Ambulatory Visit | Attending: Internal Medicine | Admitting: Internal Medicine

## 2022-11-12 DIAGNOSIS — R911 Solitary pulmonary nodule: Secondary | ICD-10-CM

## 2024-03-21 ENCOUNTER — Other Ambulatory Visit: Payer: Self-pay | Admitting: Obstetrics and Gynecology

## 2024-03-21 DIAGNOSIS — Z803 Family history of malignant neoplasm of breast: Secondary | ICD-10-CM

## 2024-04-18 ENCOUNTER — Ambulatory Visit

## 2024-05-09 ENCOUNTER — Ambulatory Visit
Admission: RE | Admit: 2024-05-09 | Discharge: 2024-05-09 | Disposition: A | Source: Ambulatory Visit | Attending: Obstetrics and Gynecology | Admitting: Obstetrics and Gynecology

## 2024-05-09 DIAGNOSIS — Z803 Family history of malignant neoplasm of breast: Secondary | ICD-10-CM

## 2024-05-09 MED ORDER — GADOPICLENOL 0.5 MMOL/ML IV SOLN
7.0000 mL | Freq: Once | INTRAVENOUS | Status: AC | PRN
  Administered 2024-05-09: 7 mL via INTRAVENOUS
